# Patient Record
Sex: Male | Born: 1950 | Race: Black or African American | Hispanic: No | Marital: Single | Smoking: Never smoker
Health system: Southern US, Community
[De-identification: ages and names within clinical notes are randomized; demographics above are authoritative.]

## PROBLEM LIST (undated history)

## (undated) DIAGNOSIS — I10 Essential (primary) hypertension: Secondary | ICD-10-CM

---

## 2011-03-14 HISTORY — PX: CORONARY ANGIOPLASTY WITH STENT PLACEMENT: SHX49

## 2016-03-08 ENCOUNTER — Encounter (HOSPITAL_COMMUNITY): Payer: Self-pay | Admitting: Emergency Medicine

## 2016-03-08 ENCOUNTER — Ambulatory Visit (HOSPITAL_COMMUNITY)
Admission: EM | Admit: 2016-03-08 | Discharge: 2016-03-08 | Disposition: A | Discharge status: Home / Self Care | Payer: Medicare (Managed Care) | Attending: Emergency Medicine | Admitting: Emergency Medicine

## 2016-03-08 DIAGNOSIS — S39012A Strain of muscle, fascia and tendon of lower back, initial encounter: Secondary | ICD-10-CM | POA: Diagnosis not present

## 2016-03-08 HISTORY — DX: Essential (primary) hypertension: I10

## 2016-03-08 MED ORDER — KETOROLAC TROMETHAMINE 30 MG/ML IJ SOLN
INTRAMUSCULAR | Status: AC
  Filled 2016-03-08: qty 1

## 2016-03-08 MED ORDER — METHYLPREDNISOLONE ACETATE 40 MG/ML IJ SUSP
INTRAMUSCULAR | Status: AC
  Filled 2016-03-08: qty 1

## 2016-03-08 MED ORDER — CYCLOBENZAPRINE HCL 10 MG PO TABS: 10.0000 mg | ORAL_TABLET | Freq: Two times a day (BID) | ORAL | 0 refills | Status: AC | PRN

## 2016-03-08 MED ORDER — METHYLPREDNISOLONE ACETATE 40 MG/ML IJ SUSP
40.0000 mg | Freq: Once | INTRAMUSCULAR | Status: AC
  Administered 2016-03-08: 40 mg via INTRAMUSCULAR

## 2016-03-08 MED ORDER — KETOROLAC TROMETHAMINE 30 MG/ML IJ SOLN
30.0000 mg | Freq: Once | INTRAMUSCULAR | Status: AC
  Administered 2016-03-08: 30 mg via INTRAMUSCULAR

## 2016-03-08 NOTE — ED Provider Notes (Signed)
CSN: [id number]     Arrival date & time 03/08/16  1011 History   First MD Initiated Contact with Patient 03/08/16 1138     Chief Complaint  Patient presents with  . Back Pain   (Consider location/radiation/quality/duration/timing/severity/associated sxs/prior Treatment) 65 year old male patient presents to clinic with chief complaint of lower back pain since last night. Patient denies history of trauma, fall, or lifting heavy objects. Patient's pain is described as a sharp aching pain, lower back to the right side. Denies burning, shooting, tingling, or radiating pain to his legs, denies loss of sensation, denies loss of control of bowel or bladder, has no fever, or other red flag signs.   The history is provided by the patient.  Back Pain  Associated symptoms: no fever, no headaches and no numbness     Past Medical History:  Diagnosis Date  . Hypertension    Past Surgical History:  Procedure Laterality Date  . CORONARY ANGIOPLASTY WITH STENT PLACEMENT  2013   No family history on file. Social History  Substance Use Topics  . Smoking status: Never Smoker  . Smokeless tobacco: Never Used  . Alcohol use Not on file     Comment: occasionally    Review of Systems  Constitutional: Negative for chills and fever.  HENT: Negative.   Respiratory: Negative.   Cardiovascular: Negative.   Gastrointestinal: Negative.   Musculoskeletal: Positive for back pain.  Neurological: Negative for dizziness, tremors, syncope, light-headedness, numbness and headaches.    Allergies  Patient has no known allergies.  Home Medications   Prior to Admission medications   Medication Sig Start Date End Date Taking? Authorizing Provider  cyclobenzaprine (FLEXERIL) 10 MG tablet Take 1 tablet (10 mg total) by mouth 2 (two) times daily as needed for muscle spasms. 03/08/16   Dorena BodoLawrence Arrow Emmerich, NP   Meds Ordered and Administered this Visit   Medications  ketorolac (TORADOL) 30 MG/ML injection 30 mg  (not administered)  methylPREDNISolone acetate (DEPO-MEDROL) injection 40 mg (not administered)    BP 115/73   Pulse 78   Temp 98.7 F (37.1 C) (Oral)   Resp 16   Ht 5' 8.5" (1.74 m)   Wt 155 lb (70.3 kg)   SpO2 99%   BMI 23.22 kg/m  No data found.   Physical Exam  Constitutional: He is oriented to person, place, and time. He appears well-developed and well-nourished. No distress.  HENT:  Head: Normocephalic.  Right Ear: External ear normal.  Left Ear: External ear normal.  Neck: Normal range of motion. Neck supple. No JVD present.  Cardiovascular: Normal rate and regular rhythm.   Pulmonary/Chest: Effort normal and breath sounds normal.  Abdominal: Soft. Bowel sounds are normal.  Musculoskeletal: He exhibits tenderness.       Back:  Lymphadenopathy:    He has no cervical adenopathy.  Neurological: He is oriented to person, place, and time.  Skin: Skin is warm and dry. Capillary refill takes less than 2 seconds. He is not diaphoretic.  Psychiatric: He has a normal mood and affect.  Nursing note and vitals reviewed.   Urgent Care Course   Clinical Course     Procedures (including critical care time)  Labs Review Labs Reviewed - No data to display  Imaging Review No results found.   Visual Acuity Review  Right Eye Distance:   Left Eye Distance:   Bilateral Distance:    Right Eye Near:   Left Eye Near:    Bilateral Near:  MDM   1. Strain of lumbar region, initial encounter    Patient given injection of Toradol and methylprednisolone in clinic.  Patient advised to drink plenty of fluids and to avoid using Advil or other over the counter NSAIDS for pain. May take tylenol as needed for pain, given an rx for Flexeril and advised to follow up with PCP should symptoms fail to improve.    Dorena BodoLawrence Dequavious Harshberger, NP 03/08/16 1158

## 2016-03-08 NOTE — Discharge Instructions (Signed)
You have been given an injection of Toradol and an injection of steroid in clinic today. You have been given a prescription for a muscle relaxant today. Take you medicine as prescribed. Avoid taking any ibuprofen or naproxen until seeing your primary care provider, you may take tylenol as needed for pain. Should symptoms fail to improve or worsen follow up with your primary care provider or return to clinic.

## 2016-03-08 NOTE — ED Triage Notes (Signed)
PT reports back spasms that started last night. No known injury or increased strain to back. PT took ibuprofen this AM

## 2016-03-09 ENCOUNTER — Emergency Department (HOSPITAL_COMMUNITY): Payer: Medicare (Managed Care)

## 2016-03-09 ENCOUNTER — Emergency Department (HOSPITAL_COMMUNITY)
Admission: EM | Admit: 2016-03-09 | Discharge: 2016-03-09 | Disposition: A | Discharge status: Home / Self Care | Payer: Medicare (Managed Care) | Attending: Emergency Medicine | Admitting: Emergency Medicine

## 2016-03-09 ENCOUNTER — Encounter (HOSPITAL_COMMUNITY): Payer: Self-pay | Admitting: *Deleted

## 2016-03-09 DIAGNOSIS — M545 Low back pain, unspecified: Secondary | ICD-10-CM

## 2016-03-09 DIAGNOSIS — Z955 Presence of coronary angioplasty implant and graft: Secondary | ICD-10-CM | POA: Insufficient documentation

## 2016-03-09 DIAGNOSIS — R935 Abnormal findings on diagnostic imaging of other abdominal regions, including retroperitoneum: Secondary | ICD-10-CM | POA: Diagnosis not present

## 2016-03-09 DIAGNOSIS — N390 Urinary tract infection, site not specified: Secondary | ICD-10-CM | POA: Diagnosis not present

## 2016-03-09 DIAGNOSIS — I10 Essential (primary) hypertension: Secondary | ICD-10-CM | POA: Insufficient documentation

## 2016-03-09 LAB — URINALYSIS, ROUTINE W REFLEX MICROSCOPIC
BILIRUBIN URINE: NEGATIVE
Glucose, UA: NEGATIVE mg/dL
Ketones, ur: NEGATIVE mg/dL
Nitrite: NEGATIVE
PROTEIN: NEGATIVE mg/dL
SQUAMOUS EPITHELIAL / LPF: NONE SEEN
Specific Gravity, Urine: 1.016 (ref 1.005–1.030)
pH: 5 (ref 5.0–8.0)

## 2016-03-09 LAB — CBC WITH DIFFERENTIAL/PLATELET
BASOS ABS: 0 10*3/uL (ref 0.0–0.1)
BASOS PCT: 0 %
EOS ABS: 0 10*3/uL (ref 0.0–0.7)
EOS PCT: 1 %
HCT: 35 % — ABNORMAL LOW (ref 39.0–52.0)
Hemoglobin: 11.5 g/dL — ABNORMAL LOW (ref 13.0–17.0)
Lymphocytes Relative: 25 %
Lymphs Abs: 1.3 10*3/uL (ref 0.7–4.0)
MCH: 25.6 pg — AB (ref 26.0–34.0)
MCHC: 32.9 g/dL (ref 30.0–36.0)
MCV: 77.8 fL — ABNORMAL LOW (ref 78.0–100.0)
Monocytes Absolute: 0.6 10*3/uL (ref 0.1–1.0)
Monocytes Relative: 12 %
Neutro Abs: 3.1 10*3/uL (ref 1.7–7.7)
Neutrophils Relative %: 62 %
PLATELETS: 155 10*3/uL (ref 150–400)
RBC: 4.5 MIL/uL (ref 4.22–5.81)
RDW: 14.6 % (ref 11.5–15.5)
WBC: 5 10*3/uL (ref 4.0–10.5)

## 2016-03-09 LAB — BASIC METABOLIC PANEL
Anion gap: 8 (ref 5–15)
BUN: 14 mg/dL (ref 6–20)
CALCIUM: 9.1 mg/dL (ref 8.9–10.3)
CO2: 24 mmol/L (ref 22–32)
CREATININE: 0.94 mg/dL (ref 0.61–1.24)
Chloride: 108 mmol/L (ref 101–111)
GFR calc Af Amer: >60 mL/min (ref >60)
Glucose, Bld: 107 mg/dL — ABNORMAL HIGH (ref 65–99)
Potassium: 4.1 mmol/L (ref 3.5–5.1)
SODIUM: 140 mmol/L (ref 135–145)

## 2016-03-09 MED ORDER — DIAZEPAM 2 MG PO TABS
2.0000 mg | ORAL_TABLET | Freq: Once | ORAL | Status: DC
  Filled 2016-03-09: qty 1

## 2016-03-09 MED ORDER — CEFTRIAXONE SODIUM 1 G IJ SOLR
1.0000 g | Freq: Once | INTRAMUSCULAR | Status: AC
  Administered 2016-03-09: 1 g via INTRAMUSCULAR
  Filled 2016-03-09: qty 10

## 2016-03-09 MED ORDER — DIAZEPAM 2 MG PO TABS: 2.0000 mg | ORAL_TABLET | Freq: Three times a day (TID) | ORAL | 0 refills | Status: AC | PRN

## 2016-03-09 MED ORDER — DIAZEPAM 5 MG PO TABS
5.0000 mg | ORAL_TABLET | Freq: Once | ORAL | Status: AC
  Administered 2016-03-09: 5 mg via ORAL
  Filled 2016-03-09: qty 1

## 2016-03-09 MED ORDER — CEPHALEXIN 500 MG PO CAPS: 500.0000 mg | ORAL_CAPSULE | Freq: Three times a day (TID) | ORAL | 0 refills | Status: AC

## 2016-03-09 NOTE — ED Provider Notes (Signed)
[facility]-EMERGENCY DEPT Provider Note   CSN: [id number] Arrival date & time: 03/09/16  1211    By signing my name below, I, Valentino SaxonBianca Contreras, attest that this documentation has been prepared under the direction and in the presence of Donivan Scullobyn Marca Gadsby, NP. Electronically Signed: Valentino SaxonBianca Contreras, ED Scribe. 03/09/16. 1:19 PM.  History   Chief Complaint Chief Complaint  Patient presents with  . Back Pain   The history is provided by the patient. No language interpreter was used.   HPI Comments: Suzan SlickVictor Wehling is a 65 y.o. male with PMHx of HTN, who presents to the Emergency Department complaining of R low back pain, gradual onset 2 days ago, constant, describes as "spasming," no radiation, worse with bending and twisting. [facility] and given  Toradol followed by an injection of methylprednisolone with minimal relief.  Pt denies history of trauma, fall, lifting heavy objects, prolonged sitting, or any known injury. Denies fevers, chills, unexplained weight loss, dizziness, vision changes, neck pain, Cp, SOB, cough, abd pain, n/v/d, dysuria, hematuria, bladder or bowel dysfunction, saddle anesthesia, extremity numbness/tingling/weakness, or any additional concerns. He denies hx of kidney stones or abdominal and back surgeries. NKDA. He notes having a coronary angioplasty with stent placement in 2013, no anticoag use.  Past Medical History:  Diagnosis Date  . Hypertension     There are no active problems to display for this patient.   Past Surgical History:  Procedure Laterality Date  . CORONARY ANGIOPLASTY WITH STENT PLACEMENT  2013       Home Medications    Prior to Admission medications   Medication Sig Start Date End Date Taking? Authorizing Provider  cyclobenzaprine (FLEXERIL) 10 MG tablet Take 1 tablet (10 mg total) by mouth 2 (two) times daily as needed for muscle spasms. 03/08/16   Dorena BodoLawrence Kennard, NP    Family History No family history on  file.  Social History Social History  Substance Use Topics  . Smoking status: Never Smoker  . Smokeless tobacco: Never Used  . Alcohol use Yes     Comment: occasionally     Allergies   Patient has no known allergies.   Review of Systems Review of Systems  Constitutional: Negative for chills, fever and unexpected weight change.  Respiratory: Negative for cough and shortness of breath.   Cardiovascular: Negative for chest pain, palpitations and leg swelling.  Gastrointestinal: Negative for abdominal pain, diarrhea, nausea and vomiting.  Genitourinary: Negative for difficulty urinating, dysuria, flank pain, frequency, testicular pain and urgency.  Musculoskeletal: Positive for back pain. Negative for neck pain and neck stiffness.  Skin: Negative for rash.  Neurological: Negative for dizziness, weakness, numbness and headaches.  Hematological: Does not bruise/bleed easily.  All other systems reviewed and are negative.    Physical Exam Updated Vital Signs BP 112/81 (BP Location: Left Arm)   Pulse 76   Temp 98 F (36.7 C) (Oral)   Resp 18   Ht 5' 8.5" (1.74 m)   Wt 155 lb (70.3 kg)   SpO2 94%   BMI 23.22 kg/m   Physical Exam  Constitutional: He is oriented to person, place, and time. He appears well-developed and well-nourished.  HENT:  Head: Normocephalic and atraumatic.  Eyes: Conjunctivae and EOM are normal. Pupils are equal, round, and reactive to light.  Neck: Normal range of motion and full passive range of motion without pain. Neck supple.  Cardiovascular: Normal rate, regular rhythm, normal heart sounds and intact distal pulses.  Exam reveals no  gallop and no friction rub.   No murmur heard. Pulmonary/Chest: Effort normal and breath sounds normal. He has no wheezes. He has no rales.  Abdominal: Soft. Bowel sounds are normal. There is no tenderness. There is no rebound and no guarding.  Musculoskeletal: Normal range of motion.  +R lumbosacral paraspinal muscles  ttp, no midline tenderness, no stepoff. Negative SLR bilaterally. Sharp and dull sensastion grossly intact to bilateral Le. Patellar and achilles reflexes 2+. Dorsalis pedis and posterior tibial pulses 2+, cap refill <2 sec.   Neurological: He is alert and oriented to person, place, and time. He has normal strength and normal reflexes. No cranial nerve deficit or sensory deficit. GCS eye subscore is 4. GCS verbal subscore is 5. GCS motor subscore is 6.  Reflex Scores:      Patellar reflexes are 2+ on the right side and 2+ on the left side.      Achilles reflexes are 2+ on the right side and 2+ on the left side. Skin: Skin is warm and dry. No rash noted.  Psychiatric: He has a normal mood and affect. His behavior is normal.  Nursing note and vitals reviewed.    ED Treatments / Results   DIAGNOSTIC STUDIES: Oxygen Saturation is 94% on RA, adequate by my interpretation.    COORDINATION OF CARE: 1:23 PM Discussed treatment plan with pt at bedside and pt agreed to plan.   Labs (all labs ordered are listed, but only abnormal results are displayed) Labs Reviewed  URINALYSIS, ROUTINE W REFLEX MICROSCOPIC    EKG  EKG Interpretation None       Radiology No results found.  Procedures Procedures (including critical care time)  Medications Ordered in ED Medications - No data to display   Initial Impression / Assessment and Plan / ED Course  I have reviewed the triage vital signs and the nursing notes.  Pertinent labs & imaging results that were available during my care of the patient were reviewed by me and considered in my medical decision making (see chart for details).  Clinical Course    Pt is a 65 y/o M who presents to ED for R low back pain, concern for muscle strain. Doubt cauda equina, epidural abscess/hematoma, or acute spinal injury at this time. No red flag signs at this time. Will get UA for hematuria, lumbar and sacral xrays to r/o mass. Plan for muscle relaxer and  re-eval.   3:02 PM Pt and family at bedside updated on results; reports significant relief after valium. Pending Ua.   3:54 PM UA +infection, discussed results with pt and family. Will plan to CBC, BMP, and CT renal to r/o infected stone.   5:22 PM CT negative for kidney stone; will plan to discharge with antibiotics for UTI and muscle relaxer for back pain; discussed results with pt and family; also discussed discharge instructions, rx and safety, return precautions, and f/u; pt verbalizes understanding and agrees with plan, denies any additional concerns.   Final Clinical Impressions(s) / ED Diagnoses   Final diagnoses:  None    New Prescriptions New Prescriptions   No medications on file   I personally performed the services described in this documentation, which was scribed in my presence. The recorded information has been reviewed and is accurate.    Albesa Seenobyn K Aalia Greulich, NP 03/09/16 1725    Eber HongBrian Miller, MD 03/09/16 [phone]

## 2016-03-09 NOTE — ED Notes (Signed)
Patient transported to CT 

## 2016-03-09 NOTE — ED Triage Notes (Signed)
Pt was seen at urgent care yesterday and has back spasms that are not improving.  Pt got a shot and some pills yesterday. PT has right lower back pain and hurts with movement

## 2016-03-09 NOTE — Discharge Instructions (Signed)
Take Keflex (antibiotic) as prescribed for urinary tract infection. Take Valium as prescribed for muscle spasm--caution may cause sedation--do not drink alcohol, drive, or operate machinery while taking. Return to ER if you experience fevers, chills, unexplained weight loss, dizziness, vision or gait changes, chest pain, shortness of breath, abdominal pain, nausea/vomiting/diarrhea, loss of bladder or bowel control, numbness to groin, extremity numbness/tingling/weakness, worsening symptoms, or any additional concerns.

## 2016-03-11 LAB — URINE CULTURE

## 2016-03-12 ENCOUNTER — Telehealth: Payer: Self-pay

## 2016-03-12 NOTE — Telephone Encounter (Signed)
Post ED Visit - Positive Culture Follow-up  Culture report reviewed by antimicrobial stewardship pharmacist:  []  Enzo BiNathan Batchelder, Pharm.D. []  Celedonio MiyamotoJeremy Frens, Pharm.D., BCPS []  Garvin FilaMike Maccia, Pharm.D. []  Georgina PillionElizabeth Martin, Pharm.D., BCPS []  JeffersonMinh Pham, [address]Ph[address]D., BCPS, AAHIVP []  Estella HuskMichelle Turner, Pharm.D., BCPS, AAHIVP []  Tennis Mustassie Stewart, Pharm.D. []  Sherle Poeob Vincent, [address]Ph[address] Delle Reiningorey Ball Pharm D Positive urine culture Treated with Cephalexin, organism sensitive to the same and no further patient follow-up is required at this time.  Jerry CarasCullom, Satrina Magallanes Burnett 03/12/2016, 9:43 AM

## 2018-04-21 IMAGING — CR DG LUMBAR SPINE COMPLETE 4+V
5 series · 5 of 5 positions shown · non-contrast
Comparison: None.

CLINICAL DATA: Acute right-sided back pain without known injury.

EXAM:
LUMBAR SPINE - COMPLETE 4+ VIEW

[l-spine ap]
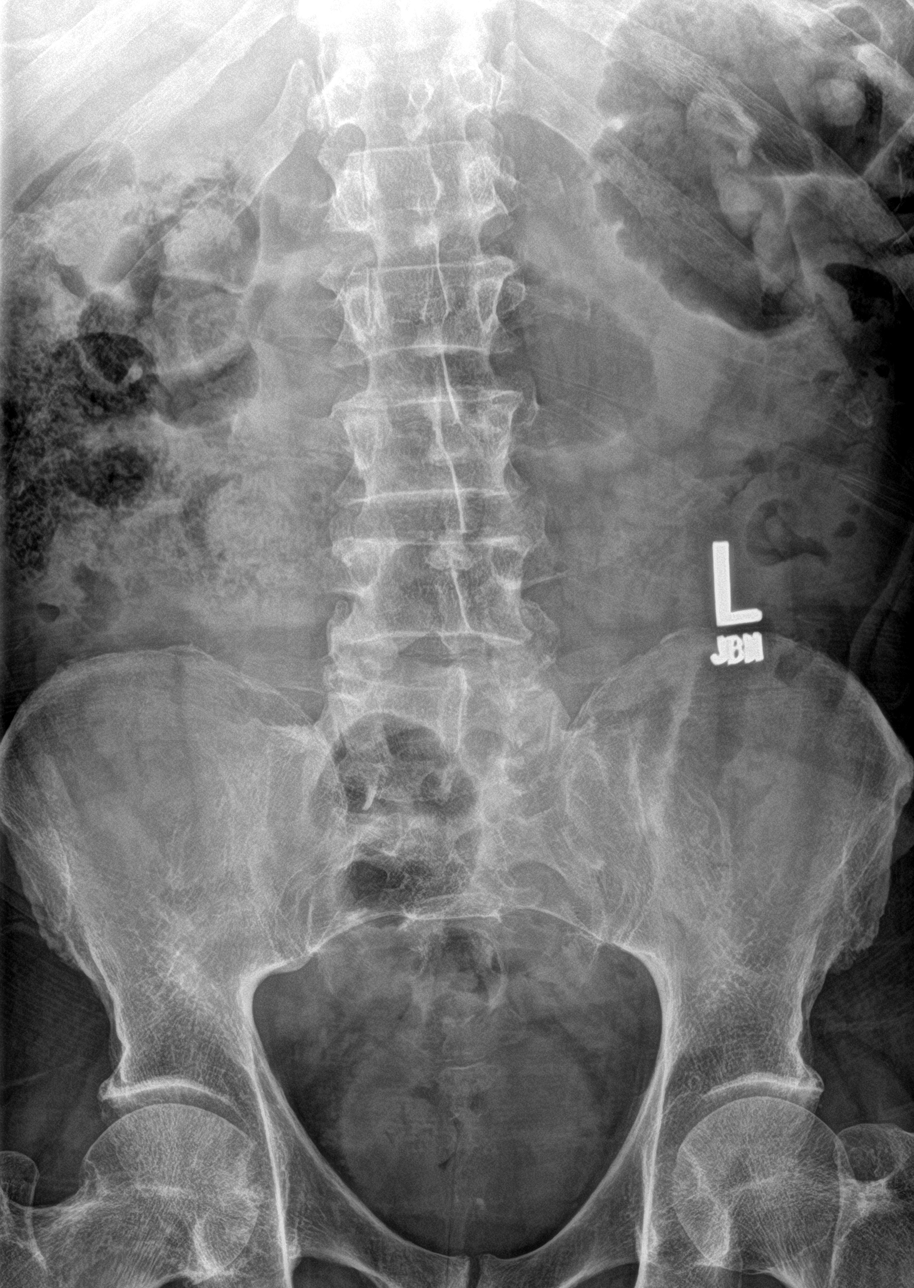

[l-spine obl (1 of 2)]
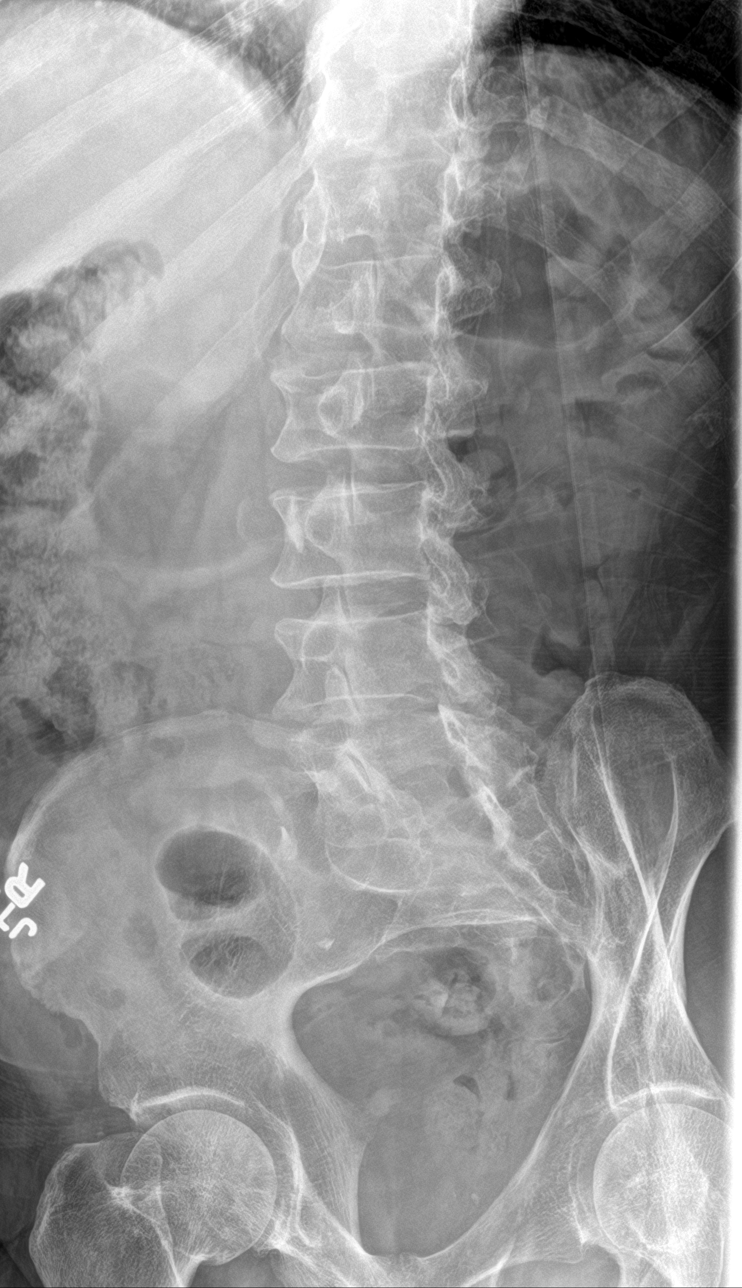

[l-spine obl (2 of 2)]
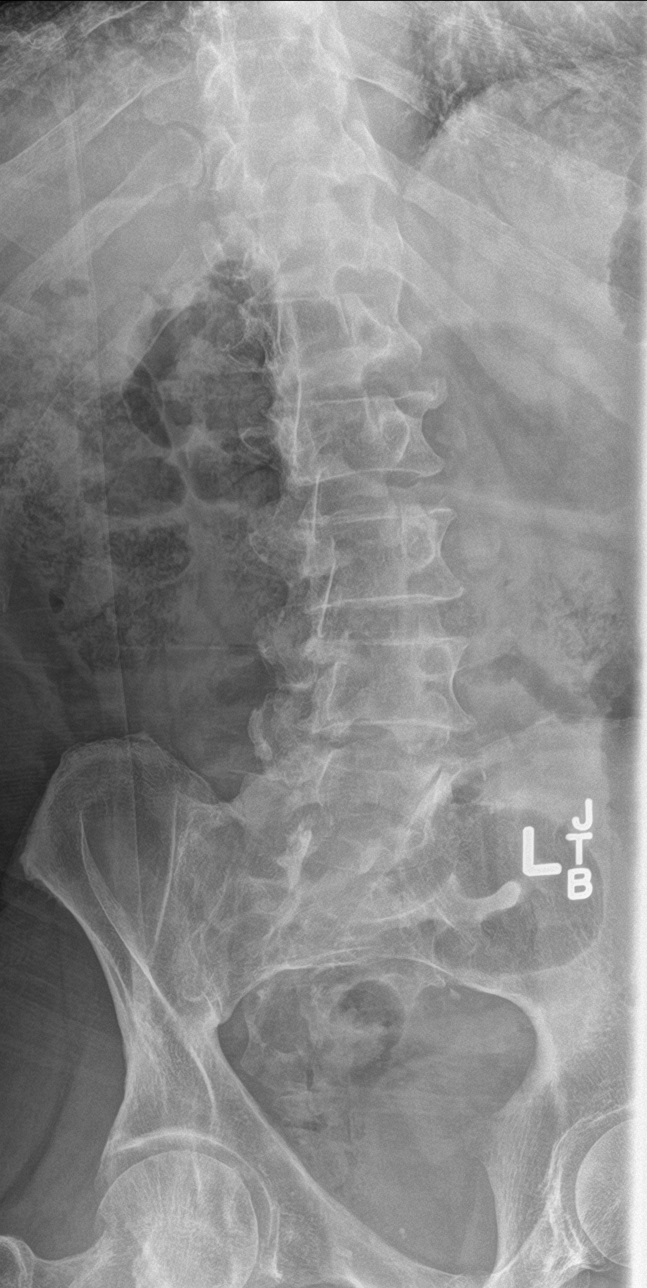

[l-spine lat]
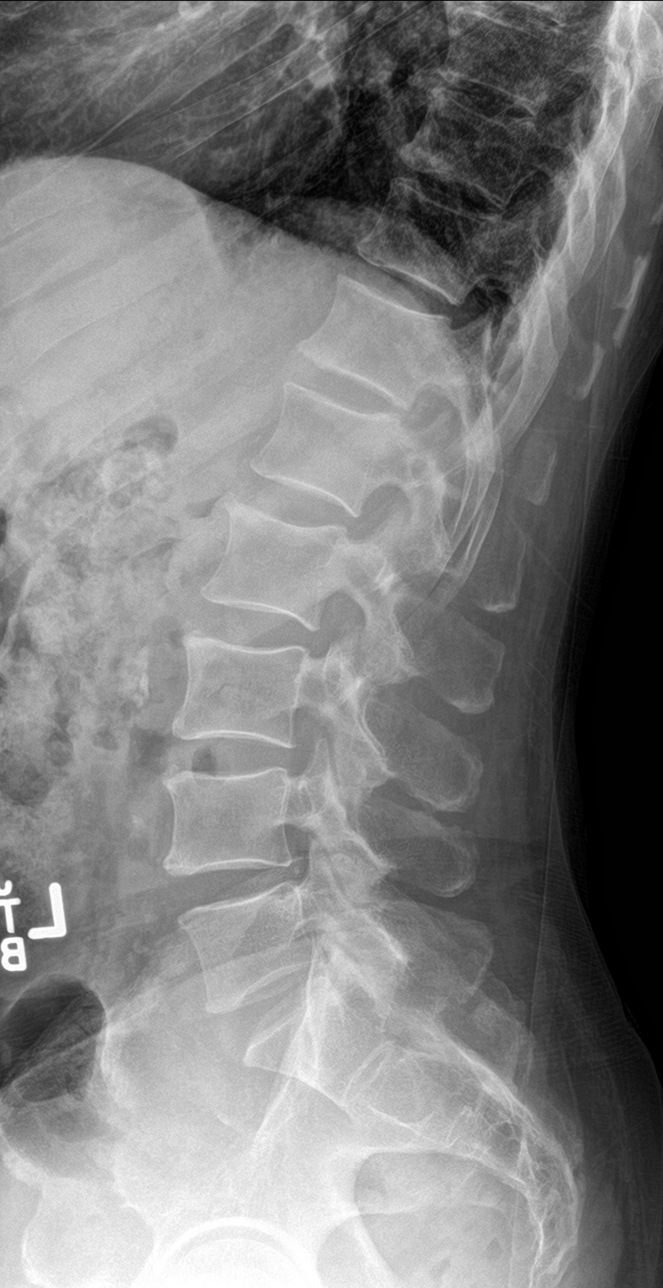

[l-spine spot]
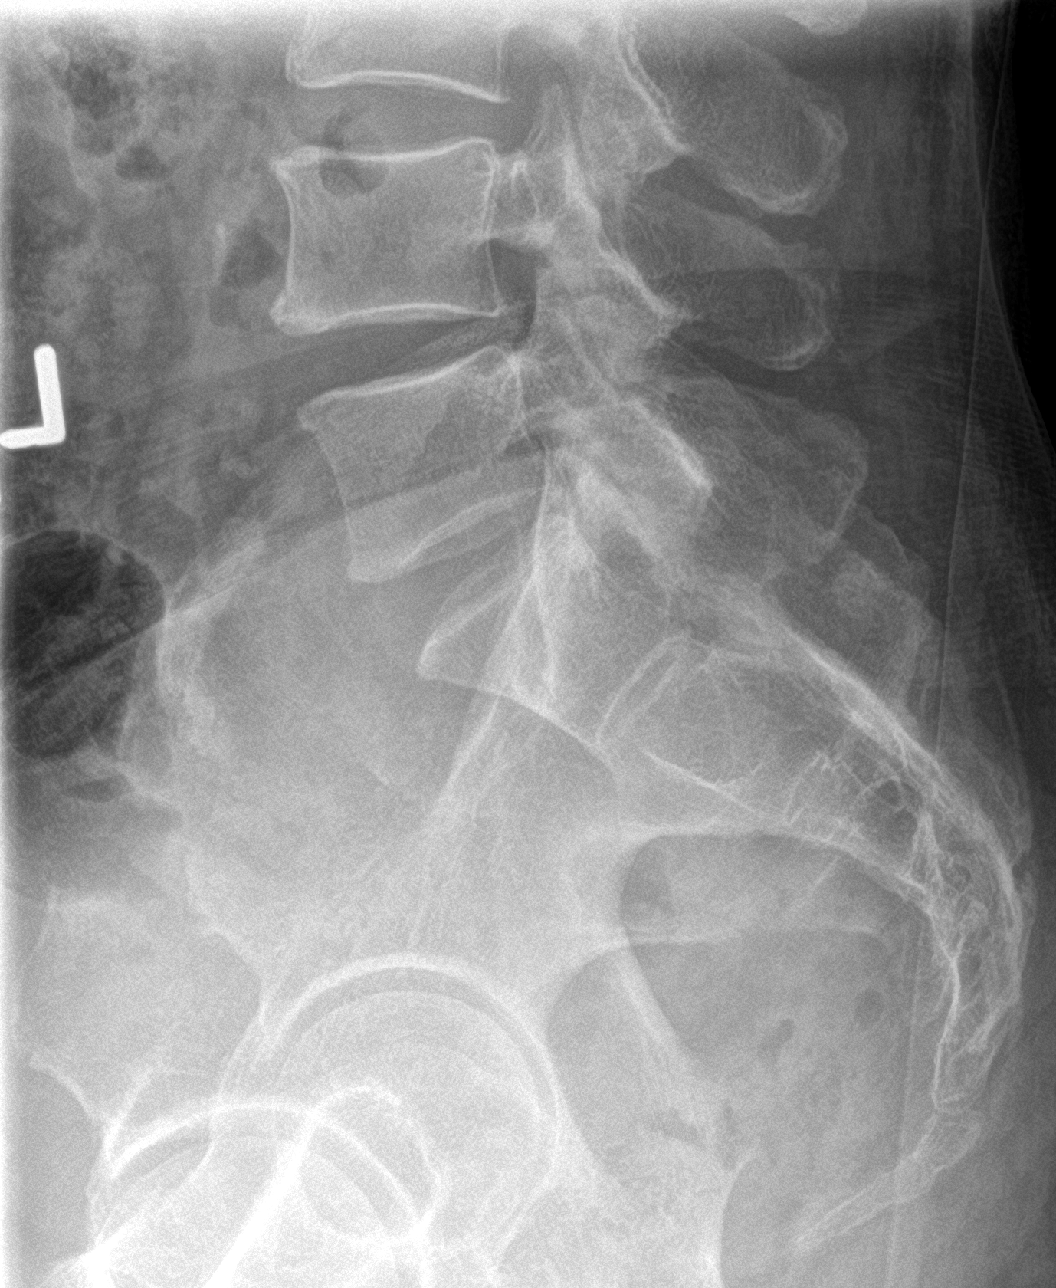

[5 of 5 positions shown; findings below may reference images not displayed]

FINDINGS: There is no evidence of lumbar spine fracture. Alignment is normal.
Intervertebral disc spaces are maintained.
IMPRESSION: Normal lumbar spine.
